# Patient Record
Sex: Male | Born: 1995 | Race: Black or African American | Hispanic: No | Marital: Single | Smoking: Current every day smoker
Health system: Southern US, Community
[De-identification: ages and names within clinical notes are randomized; demographics above are authoritative.]

## PROBLEM LIST (undated history)

## (undated) DIAGNOSIS — G930 Cerebral cysts: Secondary | ICD-10-CM

---

## 2018-01-02 ENCOUNTER — Emergency Department: Payer: Worker's Compensation

## 2018-01-02 ENCOUNTER — Encounter: Payer: Self-pay | Admitting: Emergency Medicine

## 2018-01-02 ENCOUNTER — Other Ambulatory Visit: Payer: Self-pay

## 2018-01-02 ENCOUNTER — Emergency Department
Admission: EM | Admit: 2018-01-02 | Discharge: 2018-01-02 | Disposition: A | Discharge status: Home / Self Care | Payer: Self-pay | Attending: Emergency Medicine | Admitting: Emergency Medicine

## 2018-01-02 DIAGNOSIS — F17200 Nicotine dependence, unspecified, uncomplicated: Secondary | ICD-10-CM | POA: Insufficient documentation

## 2018-01-02 DIAGNOSIS — Y939 Activity, unspecified: Secondary | ICD-10-CM | POA: Insufficient documentation

## 2018-01-02 DIAGNOSIS — S0232XA Fracture of orbital floor, left side, initial encounter for closed fracture: Secondary | ICD-10-CM | POA: Insufficient documentation

## 2018-01-02 DIAGNOSIS — Y999 Unspecified external cause status: Secondary | ICD-10-CM | POA: Insufficient documentation

## 2018-01-02 DIAGNOSIS — Y929 Unspecified place or not applicable: Secondary | ICD-10-CM | POA: Insufficient documentation

## 2018-01-02 DIAGNOSIS — S022XXA Fracture of nasal bones, initial encounter for closed fracture: Secondary | ICD-10-CM | POA: Insufficient documentation

## 2018-01-02 HISTORY — DX: Cerebral cysts: G93.0

## 2018-01-02 MED ORDER — CEPHALEXIN 500 MG PO CAPS: 500.0000 mg | ORAL_CAPSULE | Freq: Three times a day (TID) | ORAL | 0 refills | Status: AC

## 2018-01-02 MED ORDER — ERYTHROMYCIN 5 MG/GM OP OINT: 1.0000 "application " | TOPICAL_OINTMENT | Freq: Every day | OPHTHALMIC | 0 refills | Status: AC

## 2018-01-02 MED ORDER — IBUPROFEN 800 MG PO TABS
800.0000 mg | ORAL_TABLET | Freq: Once | ORAL | Status: AC
  Administered 2018-01-02: 800 mg via ORAL
  Filled 2018-01-02: qty 1

## 2018-01-02 MED ORDER — HYDROCODONE-ACETAMINOPHEN 5-325 MG PO TABS: 1.0000 | ORAL_TABLET | ORAL | 0 refills | Status: AC | PRN

## 2018-01-02 MED ORDER — KETOROLAC TROMETHAMINE 60 MG/2ML IM SOLN
60.0000 mg | Freq: Once | INTRAMUSCULAR | Status: DC
  Filled 2018-01-02: qty 2

## 2018-01-02 NOTE — ED Notes (Signed)

## 2018-01-02 NOTE — Discharge Instructions (Signed)
Please follow-up with Dr. Sharman Crate by calling the number provided to arrange a follow-up appointment within the next 2 to 3 days.  Please follow-up with Dr. Jenne Campus after that appointment.  Please take your antibiotics as prescribed for their entire course.  Return to the emergency department for any decreased vision in the left eye or any other visual changes noted.  Do not blow your nose for the next 2 weeks.
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
## 2020-05-03 ENCOUNTER — Other Ambulatory Visit: Payer: Self-pay

## 2020-05-03 DIAGNOSIS — R509 Fever, unspecified: Secondary | ICD-10-CM | POA: Diagnosis present

## 2020-05-03 DIAGNOSIS — U071 COVID-19: Secondary | ICD-10-CM | POA: Diagnosis not present

## 2020-05-03 DIAGNOSIS — E860 Dehydration: Secondary | ICD-10-CM | POA: Diagnosis not present

## 2020-05-03 DIAGNOSIS — F17200 Nicotine dependence, unspecified, uncomplicated: Secondary | ICD-10-CM | POA: Insufficient documentation

## 2020-05-03 LAB — COMPREHENSIVE METABOLIC PANEL
ALT: 51 U/L — ABNORMAL HIGH (ref 0–44)
AST: 40 U/L (ref 15–41)
Albumin: 4.8 g/dL (ref 3.5–5.0)
Alkaline Phosphatase: 81 U/L (ref 38–126)
Anion gap: 23 — ABNORMAL HIGH (ref 5–15)
BUN: 7 mg/dL (ref 6–20)
CO2: 22 mmol/L (ref 22–32)
Calcium: 10 mg/dL (ref 8.9–10.3)
Chloride: 97 mmol/L — ABNORMAL LOW (ref 98–111)
Creatinine, Ser: 1.12 mg/dL (ref 0.61–1.24)
GFR, Estimated: >60 mL/min (ref >60)
Glucose, Bld: 120 mg/dL — ABNORMAL HIGH (ref 70–99)
Potassium: 3.6 mmol/L (ref 3.5–5.1)
Sodium: 142 mmol/L (ref 135–145)
Total Bilirubin: 1.1 mg/dL (ref 0.3–1.2)
Total Protein: 8.6 g/dL — ABNORMAL HIGH (ref 6.5–8.1)

## 2020-05-03 LAB — CBC WITH DIFFERENTIAL/PLATELET
Abs Immature Granulocytes: 0.05 10*3/uL (ref 0.00–0.07)
Basophils Absolute: 0 10*3/uL (ref 0.0–0.1)
Basophils Relative: 0 %
Eosinophils Absolute: 0.1 10*3/uL (ref 0.0–0.5)
Eosinophils Relative: 1 %
HCT: 45.8 % (ref 39.0–52.0)
Hemoglobin: 15.2 g/dL (ref 13.0–17.0)
Immature Granulocytes: 0 %
Lymphocytes Relative: 13 %
Lymphs Abs: 1.5 10*3/uL (ref 0.7–4.0)
MCH: 30.4 pg (ref 26.0–34.0)
MCHC: 33.2 g/dL (ref 30.0–36.0)
MCV: 91.6 fL (ref 80.0–100.0)
Monocytes Absolute: 1.4 10*3/uL — ABNORMAL HIGH (ref 0.1–1.0)
Monocytes Relative: 13 %
Neutro Abs: 8.2 10*3/uL — ABNORMAL HIGH (ref 1.7–7.7)
Neutrophils Relative %: 73 %
Platelets: 260 10*3/uL (ref 150–400)
RBC: 5 MIL/uL (ref 4.22–5.81)
RDW: 12.5 % (ref 11.5–15.5)
WBC: 11.3 10*3/uL — ABNORMAL HIGH (ref 4.0–10.5)
nRBC: 0 % (ref 0.0–0.2)

## 2020-05-03 NOTE — ED Triage Notes (Signed)
Patient reports back pain all day and fever (101 at home.)

## 2020-05-04 ENCOUNTER — Emergency Department: Payer: 59

## 2020-05-04 ENCOUNTER — Emergency Department
Admission: EM | Admit: 2020-05-04 | Discharge: 2020-05-04 | Disposition: A | Discharge status: Home / Self Care | Payer: 59 | Attending: Emergency Medicine | Admitting: Emergency Medicine

## 2020-05-04 ENCOUNTER — Encounter: Payer: Self-pay | Admitting: Radiology

## 2020-05-04 DIAGNOSIS — U071 COVID-19: Secondary | ICD-10-CM

## 2020-05-04 DIAGNOSIS — E860 Dehydration: Secondary | ICD-10-CM

## 2020-05-04 LAB — LACTIC ACID, PLASMA: Lactic Acid, Venous: 1.7 mmol/L (ref 0.5–1.9)

## 2020-05-04 LAB — CULTURE, BLOOD (ROUTINE X 2)
Culture: NO GROWTH
Culture: NO GROWTH
Special Requests: ADEQUATE
Special Requests: ADEQUATE

## 2020-05-04 LAB — RESP PANEL BY RT-PCR (FLU A&B, COVID) ARPGX2
Influenza A by PCR: NEGATIVE
Influenza B by PCR: NEGATIVE
SARS Coronavirus 2 by RT PCR: POSITIVE — AB

## 2020-05-04 MED ORDER — IBUPROFEN 800 MG PO TABS: 800.0000 mg | ORAL_TABLET | Freq: Three times a day (TID) | ORAL | 0 refills | Status: AC | PRN

## 2020-05-04 MED ORDER — CYCLOBENZAPRINE HCL 10 MG PO TABS: 10.0000 mg | ORAL_TABLET | Freq: Three times a day (TID) | ORAL | 0 refills | Status: AC | PRN

## 2020-05-04 MED ORDER — LACTATED RINGERS IV BOLUS
1000.0000 mL | Freq: Once | INTRAVENOUS | Status: AC
  Administered 2020-05-04: 1000 mL via INTRAVENOUS

## 2020-05-04 MED ORDER — ACETAMINOPHEN 500 MG PO TABS
1000.0000 mg | ORAL_TABLET | Freq: Once | ORAL | Status: AC
  Administered 2020-05-04: 1000 mg via ORAL

## 2020-05-04 MED ORDER — KETOROLAC TROMETHAMINE 30 MG/ML IJ SOLN
15.0000 mg | Freq: Once | INTRAMUSCULAR | Status: AC
  Administered 2020-05-04: 15 mg via INTRAVENOUS
  Filled 2020-05-04: qty 1

## 2020-05-04 MED ORDER — ACETAMINOPHEN 500 MG PO TABS
ORAL_TABLET | ORAL | Status: AC
  Filled 2020-05-04: qty 2

## 2020-05-04 NOTE — ED Provider Notes (Signed)
[facility] Emergency Department Provider Note  ____________________________________________  Time seen: Approximately 2:37 AM  I have reviewed the triage vital signs and the nursing notes.   HISTORY  Chief Complaint Back Pain and Fever   HPI Randy Mcclure is a 25 y.o. male who presents for evaluation of fever and back pain.  Patient reports that his symptoms started this morning when he woke up.  He initially felt like he had slept funny but then this evening spiked a fever which prompted his visit to the emergency room.  Patient took some Tylenol earlier today.  Had a fever of 101 at home.  He denies cough, congestion, sore throat, headache, chest pain, shortness of breath, abdominal pain, vomiting or diarrhea.  Patient is unvaccinated for COVID.  No known sick exposures.  His pain is midline on the lower back, describes as a soreness and moderate in intensity.  Is worse with movement.  No saddle anesthesia, no trauma, no IV drug use, no lower extremity weakness or numbness, no urinary or bowel incontinence or retention, no dysuria or hematuria   Past Medical History:  Diagnosis Date  . Arachnoid cyst      Prior to Admission medications   Medication Sig Start Date End Date Taking? Authorizing Provider  cyclobenzaprine (FLEXERIL) 10 MG tablet Take 1 tablet (10 mg total) by mouth 3 (three) times daily as needed for muscle spasms. 05/04/20  Yes Don Perking, [place], MD  ibuprofen (ADVIL) 800 MG tablet Take 1 tablet (800 mg total) by mouth every 8 (eight) hours as needed. 05/04/20  Yes Socorro Ebron, [place], MD  cephALEXin (KEFLEX) 500 MG capsule Take 1 capsule (500 mg total) by mouth 3 (three) times daily. 01/02/18   Minna Antis, MD  erythromycin ophthalmic ointment Place 1 application into the left eye at bedtime. 01/02/18   Minna Antis, MD  HYDROcodone-acetaminophen (NORCO/VICODIN) 5-325 MG tablet Take 1 tablet by mouth every 4 (four) hours as needed.  01/02/18   Minna Antis, MD    Allergies Patient has no known allergies.  No family history on file.  Social History Social History   Tobacco Use  . Smoking status: Current Every Day Smoker  . Smokeless tobacco: Current User  Substance Use Topics  . Alcohol use: Yes  . Drug use: Never    Review of Systems  Constitutional: + fever. Eyes: Negative for visual changes. ENT: Negative for sore throat. Neck: No neck pain  Cardiovascular: Negative for chest pain. Respiratory: Negative for shortness of breath. Gastrointestinal: Negative for abdominal pain, vomiting or diarrhea. Genitourinary: Negative for dysuria. Musculoskeletal: + back pain. Skin: Negative for rash. Neurological: Negative for headaches, weakness or numbness. Psych: No SI or HI  ____________________________________________   PHYSICAL EXAM:  VITAL SIGNS: Vitals:   05/03/20 2226 05/04/20 0031  BP: (!) 183/134 122/63  Pulse: (!) 108 95  Resp: (!) 22 20  Temp: 98.3 F (36.8 C)   SpO2: 100% 98%    Constitutional: Alert and oriented. Well appearing and in no apparent distress. HEENT:      Head: Normocephalic and atraumatic.         Eyes: Conjunctivae are normal. Sclera is non-icteric.       Mouth/Throat: Mucous membranes are moist.       Neck: Supple with no signs of meningismus. Cardiovascular: Regular rate and rhythm. No murmurs, gallops, or rubs. 2+ symmetrical distal pulses are present in all extremities. No JVD. Respiratory: Normal respiratory effort. Lungs are clear to auscultation bilaterally. No wheezes,  crackles, or rhonchi.  Gastrointestinal: Soft, non tender. Musculoskeletal: Bilateral paraspinal tenderness of the lumbar spine.  No midline C, T, L-spine tenderness  Neurologic: Normal speech and language. Face is symmetric. Moving all extremities.  Intact strength and sensation x4, normal DTRs Skin: Skin is warm, dry and intact. No rash noted. Psychiatric: Mood and affect are normal.  Speech and behavior are normal.  ____________________________________________   LABS (all labs ordered are listed, but only abnormal results are displayed)  Labs Reviewed  RESP PANEL BY RT-PCR (FLU A&B, COVID) ARPGX2 - Abnormal; Notable for the following components:      Result Value   SARS Coronavirus 2 by RT PCR POSITIVE (*)    All other components within normal limits  CBC WITH DIFFERENTIAL/PLATELET - Abnormal; Notable for the following components:   WBC 11.3 (*)    Neutro Abs 8.2 (*)    Monocytes Absolute 1.4 (*)    All other components within normal limits  COMPREHENSIVE METABOLIC PANEL - Abnormal; Notable for the following components:   Chloride 97 (*)    Glucose, Bld 120 (*)    Total Protein 8.6 (*)    ALT 51 (*)    Anion gap 23 (*)    All other components within normal limits  CULTURE, BLOOD (ROUTINE X 2)  CULTURE, BLOOD (ROUTINE X 2)  LACTIC ACID, PLASMA  URINALYSIS, ROUTINE W REFLEX MICROSCOPIC  LACTIC ACID, PLASMA   ____________________________________________  EKG  none  ____________________________________________  RADIOLOGY  I have personally reviewed the images performed during this visit and I agree with the Radiologist's read.   Interpretation by Radiologist:  DG Chest 2 View  Result Date: 05/04/2020 CLINICAL DATA:  COVID, fever EXAM: CHEST - 2 VIEW COMPARISON:  None. FINDINGS: The heart size and mediastinal contours are within normal limits. Both lungs are clear. The visualized skeletal structures are unremarkable. IMPRESSION: Negative Electronically Signed   By: Charlett Nose M.D.   On: 05/04/2020 02:09   DG Lumbar Spine Complete  Result Date: 05/04/2020 CLINICAL DATA:  COVID, fever, low back pain EXAM: LUMBAR SPINE - COMPLETE 4+ VIEW COMPARISON:  None. FINDINGS: There is no evidence of lumbar spine fracture. Alignment is normal. Intervertebral disc spaces are maintained. IMPRESSION: Negative. Electronically Signed   By: Charlett Nose M.D.   On:  05/04/2020 02:10     ____________________________________________   PROCEDURES  Procedure(s) performed: None Procedures Critical Care performed:  None ____________________________________________   INITIAL IMPRESSION / ASSESSMENT AND PLAN / ED COURSE   25 y.o. male who presents for evaluation of fever and back pain since this morning.  Patient is well-appearing no distress with normal vital signs during my evaluation.  He has bilateral paraspinal tenderness of the lumbar spine with no midline CT and L-spine tenderness.  Normal work of breathing and normal sats both at rest and with ambulation.  Pain seems muscular in nature as it is worse with movement and located on paraspinal region.  There is no signs of symptoms of cauda equina, no history of IV drug use, low suspicion for discitis/ osteomyelytis.  Chest x-ray and x-ray of the lumbar spine visualized by me with no acute findings, confirmed by radiology.  Patient tested positive for COVID which is most likely the cause of his symptoms.  Labs showing mild leukocytosis with a negative lactic acidosis.  Patient also with an anion gap of 23 most likely from dehydration from COVID infection and fever.  IV fluids were given.  Toradol given for pain.  Discussed my standard return precautions with patient, quarantine, immune system boosting, oxygen monitoring, and follow-up on post-COVID clinic.  Discussed my standard return precautions. Old medical records reviewed.        _____________________________________________ Please note:  Patient was evaluated in Emergency Department today for the symptoms described in the history of present illness. Patient was evaluated in the context of the global COVID-19 pandemic, which necessitated consideration that the patient might be at risk for infection with the SARS-CoV-2 virus that causes COVID-19. Institutional protocols and algorithms that pertain to the evaluation of patients at risk for COVID-19 are in  a state of rapid change based on information released by regulatory bodies including the CDC and federal and state organizations. These policies and algorithms were followed during the patient's care in the ED.  Some ED evaluations and interventions may be delayed as a result of limited staffing during the pandemic.   [place] Controlled Substance Database was reviewed by me. ____________________________________________   FINAL CLINICAL IMPRESSION(S) / ED DIAGNOSES   Final diagnoses:  COVID-19  Dehydration      NEW MEDICATIONS STARTED DURING THIS VISIT:  ED Discharge Orders         Ordered    ibuprofen (ADVIL) 800 MG tablet  Every 8 hours PRN        05/04/20 0321    cyclobenzaprine (FLEXERIL) 10 MG tablet  3 times daily PRN        05/04/20 0321           Note:  This document was prepared using Dragon voice recognition software and may include unintentional dictation errors.    Nita Sickle, MD 05/04/20 [phone]

## 2020-05-04 NOTE — Discharge Instructions (Addendum)
Post- COVID Clinic  [phone] cov  To help boost your immune system against COVID-19, please take:  - Vitamin D3 4,000 IU/day - Vitamin C 500-1,000?mg twice a day - Quercetin 250?mg twice a day - Zinc 100?mg/day - Melatonin 10?mg before bedtime (causes drowsiness) - Aspirin 325?mg/day (unless contraindicated) - Pulse Oximeter Monitoring of oxygen saturation is recommended - check your oxygen 3 times a day if less than 90% return to the ER  These medications are all over-the-counter and do not need a prescription.  QUARANTINE INSTRUCTION  Follow these instructions at home:  Protecting others To avoid spreading the illness to other people: Quarantine in your home until you have had no cough and fever for 7 days. Household members should also be quarantine for at least 14 days after being exposed to you. Wash your hands often with soap and water. If soap and water are not available, use an alcohol-based hand sanitizer. If you have not cleaned your hands, do not touch your face. Make sure that all people in your household wash their hands well and often. Cover your nose and mouth when you cough or sneeze. Throw away used tissues. Stay home if you have any cold-like or flu-like symptoms. General instructions Go to your local pharmacy and buy a pulse oximeter (this is a machine that measures your oxygen). Check your oxygen levels at least 3 times a day. If your oxygen level is 90% or less return to the emergency room immediately Take over-the-counter and prescription medicines only as told by your health care provider. If you need medication for fever take tylenol or ibuprofen Drink enough fluid to keep your urine pale yellow. Rest at home as directed by your health care provider. Do not give aspirin to a child with the flu, because of the association with Reye's syndrome. Do not use tobacco products, including cigarettes, chewing tobacco, and e-cigarettes. If you need help quitting,  ask your health care provider. Keep all follow-up visits as told by your health care provider. This is important. How is this prevented? Avoid areas where an outbreak has been reported. Avoid large groups of people. Keep a safe distance from people who are coughing and sneezing. Do not touch your face if you have not cleaned your hands. When you are around people who are sick or might be sick, wear a mask to protect yourself. Contact a health care provider if: You have symptoms of SARS (cough, fever, chest pain, shortness of breath) that are not getting better at home. You have a fever. If you have difficulty breathing go to your local ER or call 911

## 2020-05-04 NOTE — ED Notes (Signed)
Pt complains of no pain. Fluids still going. Report given to April, RN

## 2020-05-04 NOTE — ED Notes (Signed)
Date and time results received: 05/04/20  (use smartphrase ".now" to insert current time)  Test: covid Critical Value: postive  Name of Provider Notified: Dr. Dolores Frame  Orders Received? Or Actions Taken?: acknowledged

## 2020-05-09 LAB — CULTURE, BLOOD (ROUTINE X 2)

## 2022-04-17 IMAGING — CR DG LUMBAR SPINE COMPLETE 4+V
5 series · 5 of 5 positions shown · non-contrast
Comparison: None.

CLINICAL DATA: COVID, fever, low back pain

EXAM:
LUMBAR SPINE - COMPLETE 4+ VIEW

[l-spine ap]
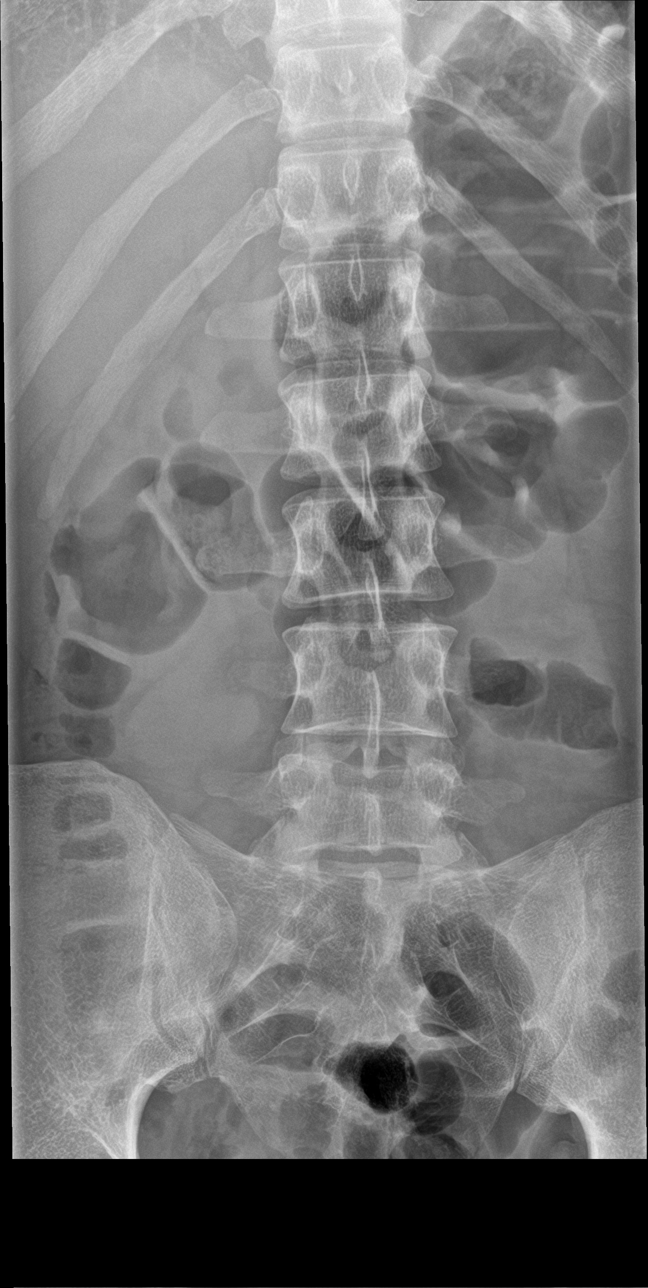

[l-spine obl (1 of 2)]
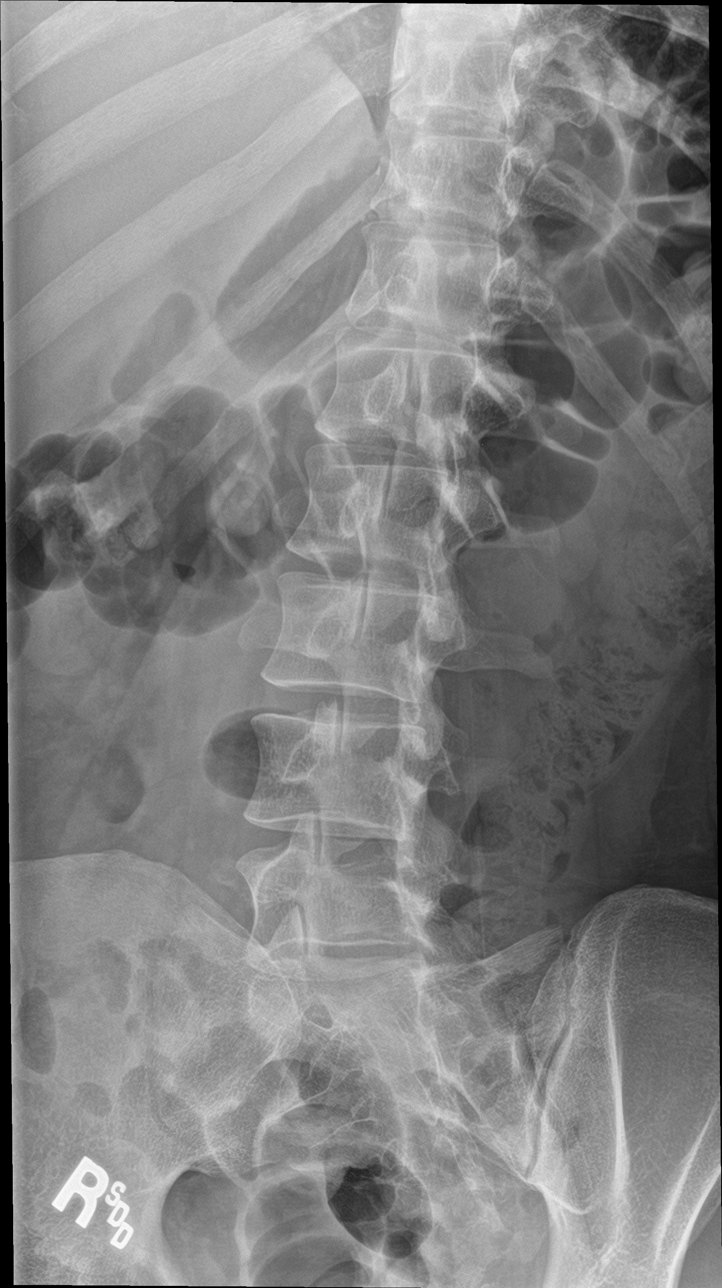

[l-spine lat]
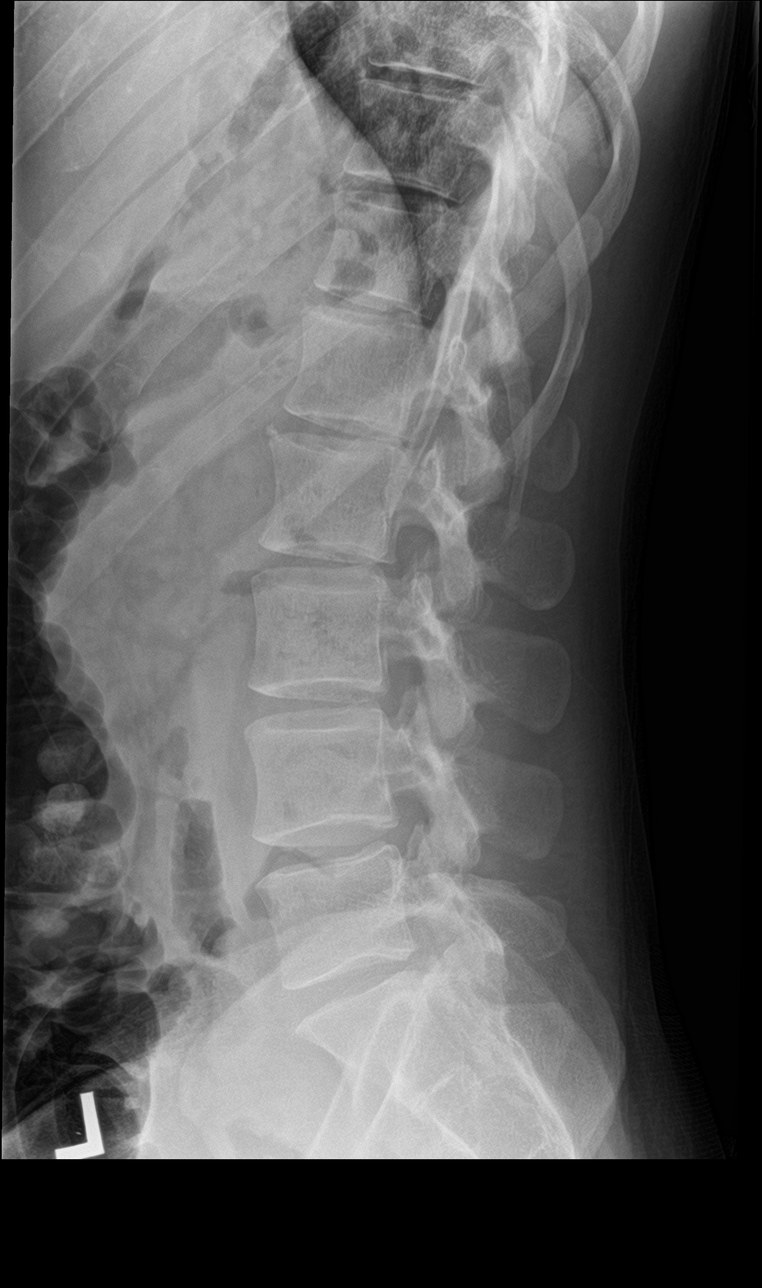

[l-spine spot]
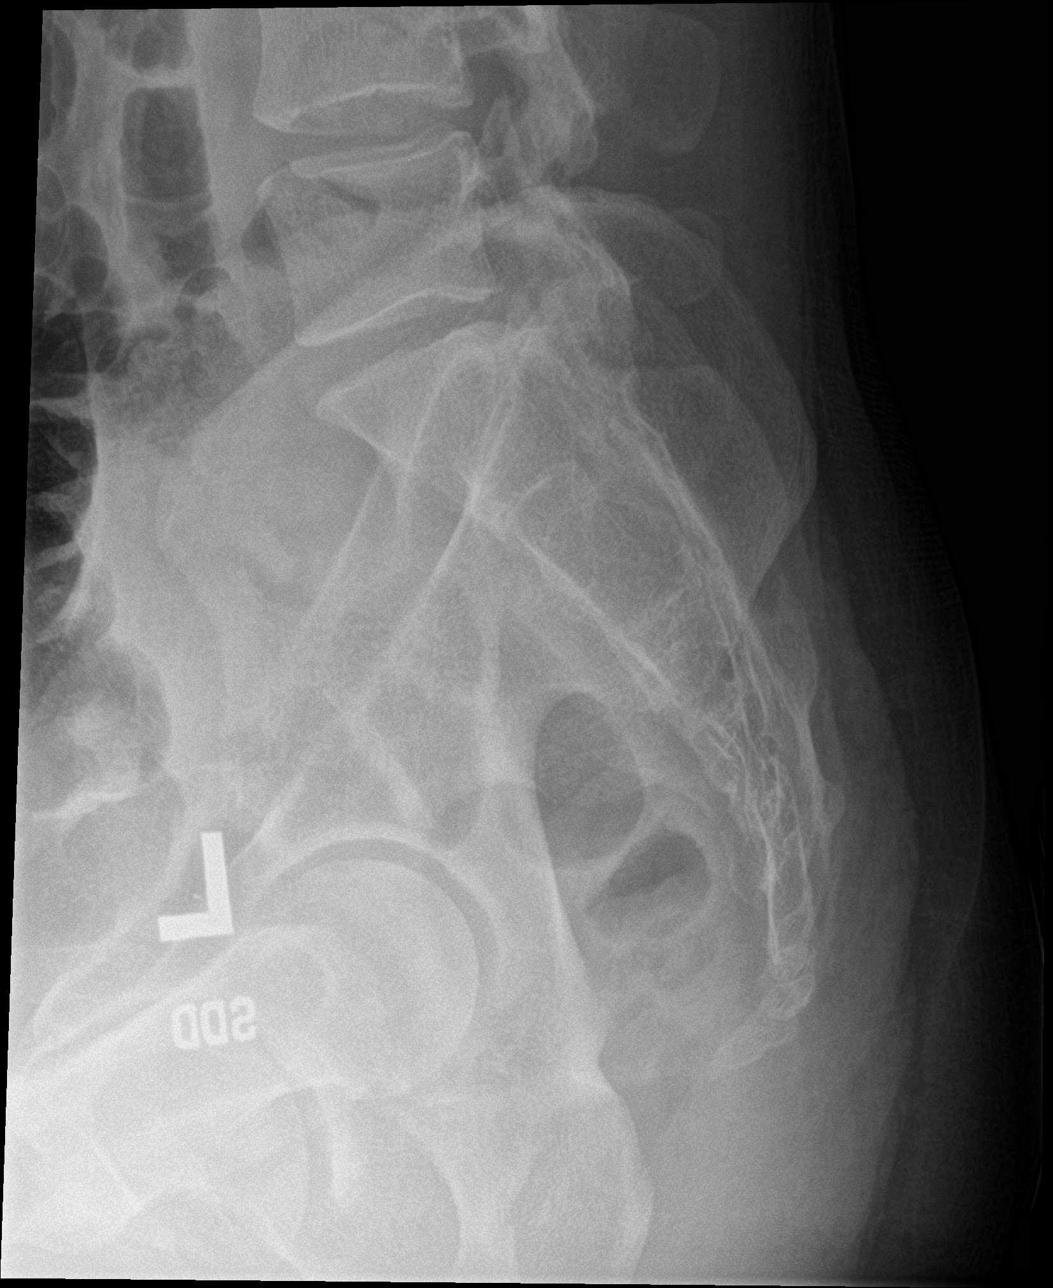

[l-spine obl (2 of 2)]
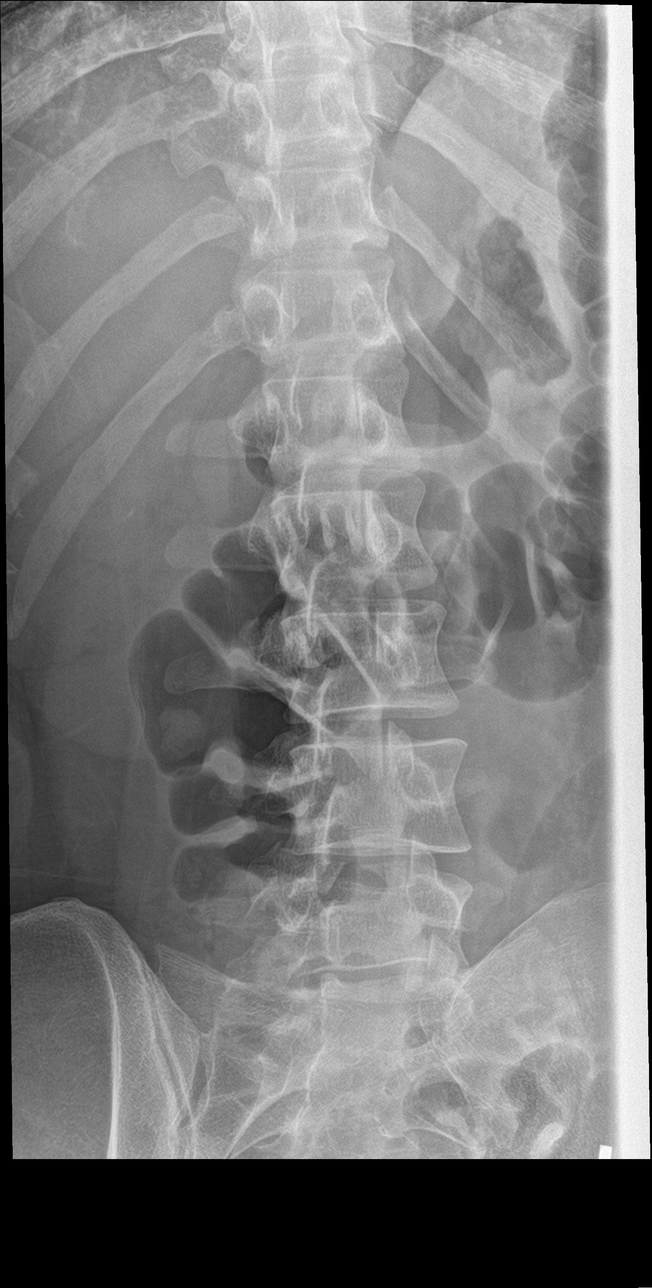

[5 of 5 positions shown; findings below may reference images not displayed]

FINDINGS: There is no evidence of lumbar spine fracture. Alignment is normal.
Intervertebral disc spaces are maintained.
IMPRESSION: Negative.

## 2022-04-17 IMAGING — CR DG CHEST 2V
2 series · 2 of 2 positions shown · non-contrast
Comparison: None.

CLINICAL DATA: COVID, fever

EXAM:
CHEST - 2 VIEW

[chest pa]
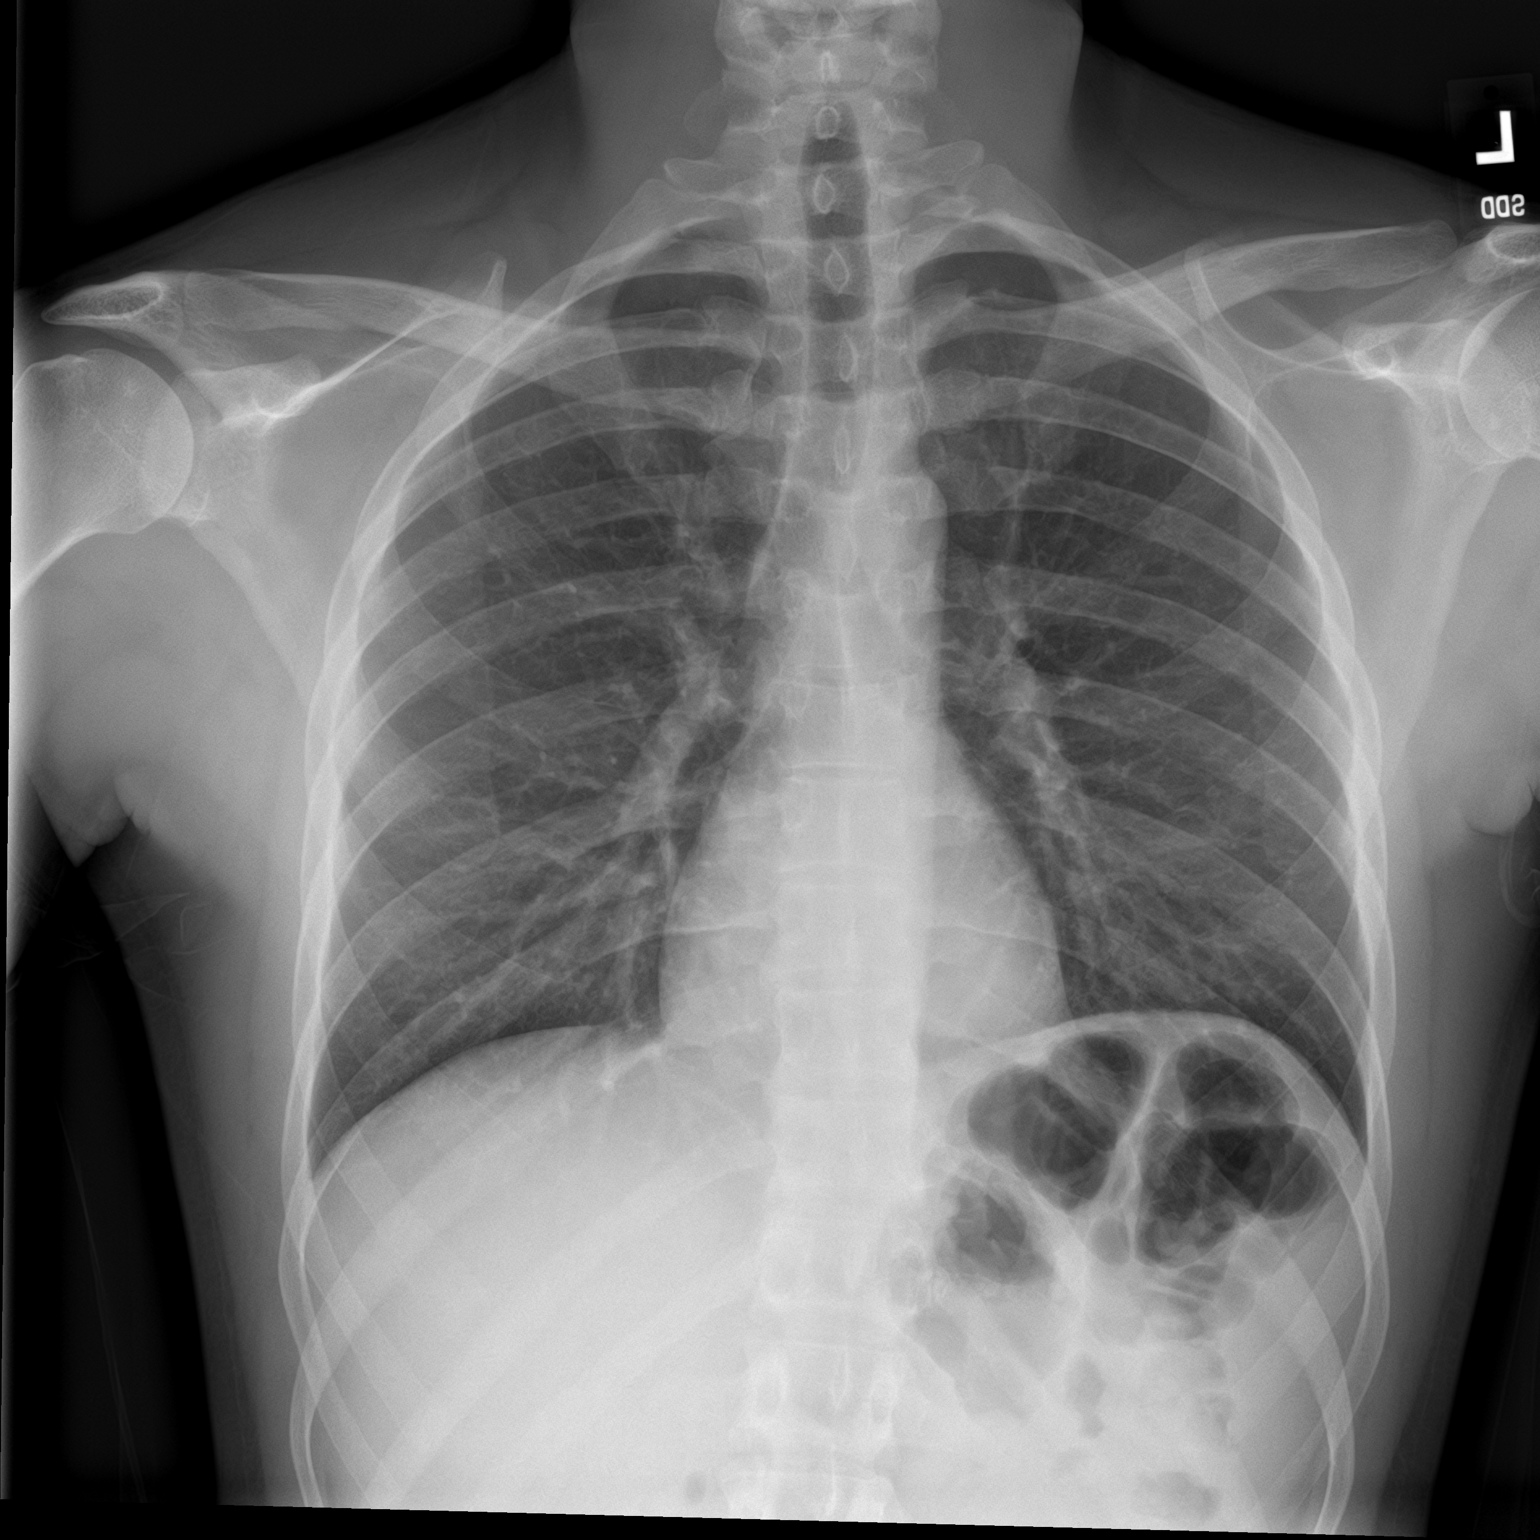

[chest lat]
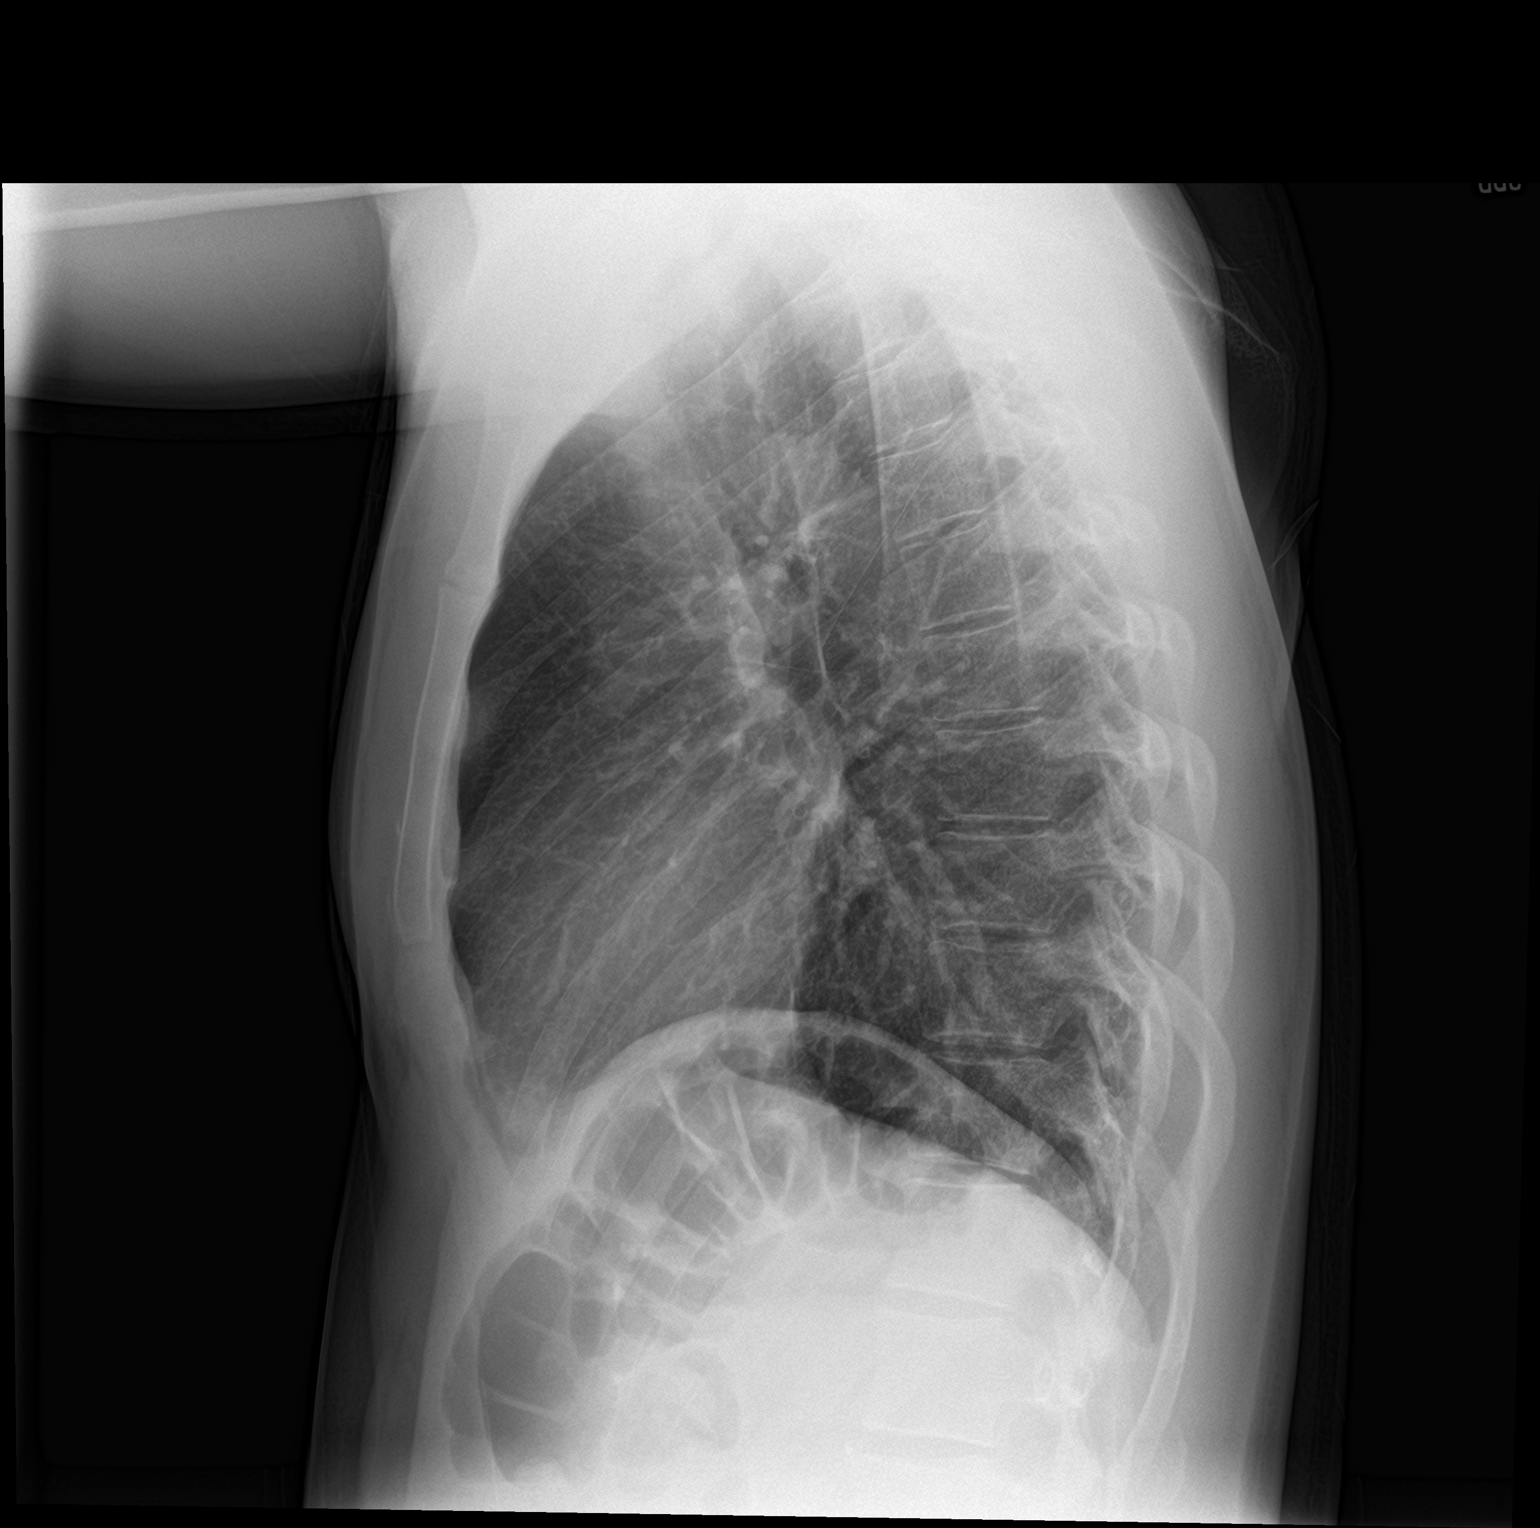

[2 of 2 positions shown; findings below may reference images not displayed]

FINDINGS: The heart size and mediastinal contours are within normal limits.
Both lungs are clear. The visualized skeletal structures are
unremarkable.
IMPRESSION: Negative
